# Patient Record
Sex: Male | Born: 1935 | Race: White | Hispanic: No | Marital: Married
Health system: Southern US, Community
[De-identification: ages and names within clinical notes are randomized; demographics above are authoritative.]

---

## 2013-01-29 ENCOUNTER — Observation Stay (HOSPITAL_COMMUNITY)
Admission: EM | Admit: 2013-01-29 | Discharge: 2013-01-30 | Disposition: A | Discharge status: Home / Self Care | Payer: Medicare (Managed Care) | Attending: Orthopedic Surgery | Admitting: Orthopedic Surgery

## 2013-01-29 ENCOUNTER — Encounter (HOSPITAL_COMMUNITY): Payer: Self-pay | Admitting: Emergency Medicine

## 2013-01-29 ENCOUNTER — Emergency Department (HOSPITAL_COMMUNITY): Payer: Medicare (Managed Care) | Admitting: Anesthesiology

## 2013-01-29 ENCOUNTER — Emergency Department (HOSPITAL_COMMUNITY): Payer: Medicare (Managed Care)

## 2013-01-29 ENCOUNTER — Encounter (HOSPITAL_COMMUNITY): Admission: EM | Disposition: A | Discharge status: Home / Self Care | Payer: Self-pay | Source: Home / Self Care

## 2013-01-29 ENCOUNTER — Inpatient Hospital Stay: Admit: 2013-01-29 | Payer: Self-pay | Admitting: Orthopedic Surgery

## 2013-01-29 ENCOUNTER — Encounter (HOSPITAL_COMMUNITY): Payer: Medicare (Managed Care) | Admitting: Anesthesiology

## 2013-01-29 DIAGNOSIS — S61209A Unspecified open wound of unspecified finger without damage to nail, initial encounter: Secondary | ICD-10-CM | POA: Diagnosis not present

## 2013-01-29 DIAGNOSIS — F0390 Unspecified dementia without behavioral disturbance: Secondary | ICD-10-CM | POA: Insufficient documentation

## 2013-01-29 DIAGNOSIS — W298XXA Contact with other powered powered hand tools and household machinery, initial encounter: Secondary | ICD-10-CM | POA: Diagnosis not present

## 2013-01-29 DIAGNOSIS — S62639A Displaced fracture of distal phalanx of unspecified finger, initial encounter for closed fracture: Secondary | ICD-10-CM | POA: Insufficient documentation

## 2013-01-29 DIAGNOSIS — I10 Essential (primary) hypertension: Secondary | ICD-10-CM | POA: Insufficient documentation

## 2013-01-29 DIAGNOSIS — Z79899 Other long term (current) drug therapy: Secondary | ICD-10-CM | POA: Diagnosis not present

## 2013-01-29 DIAGNOSIS — S68119A Complete traumatic metacarpophalangeal amputation of unspecified finger, initial encounter: Secondary | ICD-10-CM | POA: Diagnosis present

## 2013-01-29 DIAGNOSIS — IMO0002 Reserved for concepts with insufficient information to code with codable children: Principal | ICD-10-CM | POA: Insufficient documentation

## 2013-01-29 HISTORY — PX: I & D EXTREMITY: SHX5045

## 2013-01-29 LAB — BASIC METABOLIC PANEL
BUN: 23 mg/dL (ref 6–23)
CHLORIDE: 101 meq/L (ref 96–112)
CO2: 23 mEq/L (ref 19–32)
CREATININE: 1.12 mg/dL (ref 0.50–1.35)
Calcium: 9.1 mg/dL (ref 8.4–10.5)
GFR calc non Af Amer: 57 mL/min — ABNORMAL LOW (ref >90)
GFR, EST AFRICAN AMERICAN: 66 mL/min — AB (ref >90)
Glucose, Bld: 162 mg/dL — ABNORMAL HIGH (ref 70–99)
POTASSIUM: 4.7 meq/L (ref 3.7–5.3)
Sodium: 138 mEq/L (ref 137–147)

## 2013-01-29 LAB — CBC WITH DIFFERENTIAL/PLATELET
BASOS PCT: 0 % (ref 0–1)
Basophils Absolute: 0 10*3/uL (ref 0.0–0.1)
Eosinophils Absolute: 0 10*3/uL (ref 0.0–0.7)
Eosinophils Relative: 0 % (ref 0–5)
HEMATOCRIT: 37.3 % — AB (ref 39.0–52.0)
HEMOGLOBIN: 12.5 g/dL — AB (ref 13.0–17.0)
LYMPHS PCT: 18 % (ref 12–46)
Lymphs Abs: 1.7 10*3/uL (ref 0.7–4.0)
MCH: 31.2 pg (ref 26.0–34.0)
MCHC: 33.5 g/dL (ref 30.0–36.0)
MCV: 93 fL (ref 78.0–100.0)
MONO ABS: 0.4 10*3/uL (ref 0.1–1.0)
MONOS PCT: 5 % (ref 3–12)
NEUTROS PCT: 77 % (ref 43–77)
Neutro Abs: 7.2 10*3/uL (ref 1.7–7.7)
Platelets: 211 10*3/uL (ref 150–400)
RBC: 4.01 MIL/uL — AB (ref 4.22–5.81)
RDW: 12.8 % (ref 11.5–15.5)
WBC: 9.3 10*3/uL (ref 4.0–10.5)

## 2013-01-29 SURGERY — IRRIGATION AND DEBRIDEMENT EXTREMITY
Anesthesia: General | Site: Hand | Laterality: Right

## 2013-01-29 MED ORDER — GLYCOPYRROLATE 0.2 MG/ML IJ SOLN
INTRAMUSCULAR | Status: DC | PRN
  Administered 2013-01-29: 0.2 mg via INTRAVENOUS

## 2013-01-29 MED ORDER — HYDROMORPHONE HCL PF 1 MG/ML IJ SOLN
INTRAMUSCULAR | Status: AC
  Administered 2013-01-29: 0.25 mg via INTRAVENOUS
  Filled 2013-01-29: qty 1

## 2013-01-29 MED ORDER — LIDOCAINE HCL (CARDIAC) 20 MG/ML IV SOLN
INTRAVENOUS | Status: DC | PRN
  Administered 2013-01-29: 100 mg via INTRAVENOUS

## 2013-01-29 MED ORDER — CEFAZOLIN SODIUM-DEXTROSE 2-3 GM-% IV SOLR
INTRAVENOUS | Status: DC | PRN
  Administered 2013-01-29: 2 g via INTRAVENOUS

## 2013-01-29 MED ORDER — ONDANSETRON HCL 4 MG/2ML IJ SOLN
INTRAMUSCULAR | Status: DC | PRN
  Administered 2013-01-29: 4 mg via INTRAVENOUS

## 2013-01-29 MED ORDER — SUCCINYLCHOLINE CHLORIDE 20 MG/ML IJ SOLN
INTRAMUSCULAR | Status: DC | PRN
  Administered 2013-01-29: 100 mg via INTRAVENOUS

## 2013-01-29 MED ORDER — LACTATED RINGERS IV SOLN
INTRAVENOUS | Status: DC | PRN
  Administered 2013-01-29 (×2): via INTRAVENOUS

## 2013-01-29 MED ORDER — ONDANSETRON HCL 4 MG/2ML IJ SOLN: 4.0000 mg | Freq: Once | INTRAMUSCULAR | Status: AC | PRN

## 2013-01-29 MED ORDER — DEXAMETHASONE SODIUM PHOSPHATE 4 MG/ML IJ SOLN
INTRAMUSCULAR | Status: DC | PRN
  Administered 2013-01-29: 4 mg via INTRAVENOUS

## 2013-01-29 MED ORDER — BUPIVACAINE HCL (PF) 0.25 % IJ SOLN
INTRAMUSCULAR | Status: AC
  Filled 2013-01-29: qty 30

## 2013-01-29 MED ORDER — EPHEDRINE SULFATE 50 MG/ML IJ SOLN
INTRAMUSCULAR | Status: DC | PRN
  Administered 2013-01-29: 10 mg via INTRAVENOUS

## 2013-01-29 MED ORDER — SUFENTANIL CITRATE 50 MCG/ML IV SOLN
INTRAVENOUS | Status: DC | PRN
  Administered 2013-01-29: 10 ug via INTRAVENOUS

## 2013-01-29 MED ORDER — SODIUM CHLORIDE 0.9 % IR SOLN
Status: DC | PRN
  Administered 2013-01-29 (×4): 1000 mL

## 2013-01-29 MED ORDER — PROPOFOL 10 MG/ML IV BOLUS
INTRAVENOUS | Status: DC | PRN
  Administered 2013-01-29: 120 mg via INTRAVENOUS

## 2013-01-29 MED ORDER — HYDROMORPHONE HCL PF 1 MG/ML IJ SOLN
0.2500 mg | INTRAMUSCULAR | Status: DC | PRN
  Administered 2013-01-29 – 2013-01-30 (×2): 0.25 mg via INTRAVENOUS

## 2013-01-29 SURGICAL SUPPLY — 49 items
BANDAGE CONFORM 2  STR LF (GAUZE/BANDAGES/DRESSINGS) ×3 IMPLANT
BANDAGE ELASTIC 3 VELCRO ST LF (GAUZE/BANDAGES/DRESSINGS) ×3 IMPLANT
BANDAGE ELASTIC 4 VELCRO ST LF (GAUZE/BANDAGES/DRESSINGS) IMPLANT
BANDAGE GAUZE ELAST BULKY 4 IN (GAUZE/BANDAGES/DRESSINGS) IMPLANT
CLOTH BEACON ORANGE TIMEOUT ST (SAFETY) IMPLANT
CORDS BIPOLAR (ELECTRODE) ×3 IMPLANT
CUFF TOURNIQUET SINGLE 18IN (TOURNIQUET CUFF) ×3 IMPLANT
CUFF TOURNIQUET SINGLE 24IN (TOURNIQUET CUFF) IMPLANT
DRSG ADAPTIC 3X8 NADH LF (GAUZE/BANDAGES/DRESSINGS) ×3 IMPLANT
ELECT REM PT RETURN 9FT ADLT (ELECTROSURGICAL)
ELECTRODE REM PT RTRN 9FT ADLT (ELECTROSURGICAL) IMPLANT
GAUZE XEROFORM 1X8 LF (GAUZE/BANDAGES/DRESSINGS) IMPLANT
GAUZE XEROFORM 5X9 LF (GAUZE/BANDAGES/DRESSINGS) ×3 IMPLANT
GLOVE BIOGEL M STRL SZ7.5 (GLOVE) IMPLANT
GLOVE SS BIOGEL STRL SZ 8 (GLOVE) ×2 IMPLANT
GLOVE SUPERSENSE BIOGEL SZ 8 (GLOVE) ×4
GOWN SRG XL XLNG 56XLVL 4 (GOWN DISPOSABLE) ×1 IMPLANT
GOWN STRL NON-REIN LRG LVL3 (GOWN DISPOSABLE) ×6 IMPLANT
GOWN STRL NON-REIN XL XLG LVL4 (GOWN DISPOSABLE) ×2
HANDPIECE INTERPULSE COAX TIP (DISPOSABLE)
KIT BASIN OR (CUSTOM PROCEDURE TRAY) ×3 IMPLANT
KIT ROOM TURNOVER OR (KITS) ×3 IMPLANT
MANIFOLD NEPTUNE II (INSTRUMENTS) ×3 IMPLANT
NEEDLE HYPO 25GX1X1/2 BEV (NEEDLE) ×3 IMPLANT
NS IRRIG 1000ML POUR BTL (IV SOLUTION) ×3 IMPLANT
PACK ORTHO EXTREMITY (CUSTOM PROCEDURE TRAY) ×3 IMPLANT
PAD ARMBOARD 7.5X6 YLW CONV (MISCELLANEOUS) ×6 IMPLANT
PAD CAST 4YDX4 CTTN HI CHSV (CAST SUPPLIES) IMPLANT
PADDING CAST ABS 3INX4YD NS (CAST SUPPLIES) ×2
PADDING CAST ABS COTTON 3X4 (CAST SUPPLIES) ×1 IMPLANT
PADDING CAST COTTON 4X4 STRL (CAST SUPPLIES)
SET HNDPC FAN SPRY TIP SCT (DISPOSABLE) IMPLANT
SPLINT PLASTER EXTRA FAST 3X15 (CAST SUPPLIES) ×2
SPLINT PLASTER GYPS XFAST 3X15 (CAST SUPPLIES) ×1 IMPLANT
SPONGE GAUZE 4X4 12PLY (GAUZE/BANDAGES/DRESSINGS) ×3 IMPLANT
SPONGE LAP 18X18 X RAY DECT (DISPOSABLE) IMPLANT
SPONGE LAP 4X18 X RAY DECT (DISPOSABLE) ×3 IMPLANT
SUT CHROMIC 3 0 PS 2 (SUTURE) ×9 IMPLANT
SUT CHROMIC 4 0 PS 2 18 (SUTURE) ×9 IMPLANT
SUT CHROMIC 5 0 P 3 (SUTURE) ×3 IMPLANT
SUT PROLENE 4 0 PS 2 18 (SUTURE) ×9 IMPLANT
SYR CONTROL 10ML LL (SYRINGE) ×3 IMPLANT
TOWEL OR 17X24 6PK STRL BLUE (TOWEL DISPOSABLE) ×3 IMPLANT
TOWEL OR 17X26 10 PK STRL BLUE (TOWEL DISPOSABLE) ×3 IMPLANT
TUBE ANAEROBIC SPECIMEN COL (MISCELLANEOUS) IMPLANT
TUBE CONNECTING 12'X1/4 (SUCTIONS) ×1
TUBE CONNECTING 12X1/4 (SUCTIONS) ×2 IMPLANT
WATER STERILE IRR 1000ML POUR (IV SOLUTION) IMPLANT
YANKAUER SUCT BULB TIP NO VENT (SUCTIONS) ×3 IMPLANT

## 2013-01-29 NOTE — H&P (Signed)
Cameron Schwinn is an 80 y.o. male.   Chief Complaint: Table saw injury right hand HPI: Patient presents for surgical management status post table saw injury. He has severe lacerations with near complete amputation to the small finger as was major intensive damage to the ring finger and a middle fingertip injury that involves skin subcutaneous tissue and bone. His index finger also has severe deformity and soft tissue loss.  I discussed with he and his family the issues. He was referred from [place] [facility] for evaluation and surgical management. I would recommend immediate I&D and repair is necessary including revision amputations. He is 80 years of age. He is still using a table saw but his had another recent injury on the left hand according to the ER and his notes.  He denies neck back chest or abdominal pain. He is alert and oriented  History reviewed. No pertinent past medical history.  History reviewed. No pertinent past surgical history.  History reviewed. No pertinent family history. Social History:  has no tobacco, alcohol, and drug history on file.  Allergies: Not on File   (Not in a hospital admission)  No results found for this or any previous visit (from the past 48 hour(s)). No results found.  Review of Systems  Constitutional: Negative.   HENT: Negative.   Respiratory: Negative.   Cardiovascular: Negative.   Gastrointestinal: Negative.   Musculoskeletal:       See chart. Patient has a severe table saw injury to the right hand  Neurological: Negative.   Endo/Heme/Allergies: Negative.   Psychiatric/Behavioral: Negative.     Blood pressure 155/63, pulse 59, temperature 97.3 F (36.3 C), temperature source Oral, resp. rate 15, weight 81.647 kg (180 lb), SpO2 99.00%. Physical Exam soft tissue injury to the index and middle finger with near-complete dictation to the middle fingertip. He has a lot of bleeding and swelling about the hand in general. I've loosened his bandage to  review the wounds. Small finger has significant almost complete amputation. The ring finger has significant soft tissue injury as well. The patient has no evidence of wrist or forearm deformity or injury. I've reviewed this with him at length.  Left arm has IV access and is neurovascularly intact. Lower extremity examination is benign.  Marland Kitchen.The patient is alert and oriented in no acute distress the patient complains of pain in the affected upper extremity.  The patient is noted to have a normal HEENT exam.  Lung fields show equal chest expansion and no shortness of breath  abdomen exam is nontender without distention.  Lower extremity examination does not show any fracture dislocation or blood clot symptoms.  Pelvis is stable neck and back are stable and nontender  Assessment/Plan Table saw injury right hand. Will plan emergent I&D and repair is necessary with revision amputations as necessary. He understands this and desires to proceed.  Marland Kitchen.We are planning surgery for your upper extremity. The risk and benefits of surgery include risk of bleeding infection anesthesia damage to normal structures and failure of the surgery to accomplish its intended goals of relieving symptoms and restoring function with this in mind we'll going to proceed. I have specifically discussed with the patient the pre-and postoperative regime and the does and don'ts and risk and benefits in great detail. Risk and benefits of surgery also include risk of dystrophy chronic nerve pain failure of the healing process to go onto completion and other inherent risks of surgery The relavent the pathophysiology of the disease/injury process, as well  as the alternatives for treatment and postoperative course of action has been discussed in great detail with the patient who desires to proceed.  We will do everything in our power to help you (the patient) restore function to the upper extremity. Is a pleasure to see this patient  today.  Karen ChafeGRAMIG III,Brandell Maready M 01/29/2013, 7:57 PM

## 2013-01-29 NOTE — ED Notes (Signed)
MD at bedside. Gramger

## 2013-01-29 NOTE — Transfer of Care (Signed)
Immediate Anesthesia Transfer of Care Note  Patient: Elijah Reilly  Procedure(s) Performed: Procedure(s): IRRIGATION AND DEBRIDEMENT HAND REV AMPUTATION REPAIR AS NEEDED (Right)  Patient Location: PACU  Anesthesia Type:General  Level of Consciousness: sedated, patient cooperative and responds to stimulation  Airway & Oxygen Therapy: Patient Spontanous Breathing and Patient connected to nasal cannula oxygen  Post-op Assessment: Report given to PACU RN, Post -op Vital signs reviewed and stable and Patient moving all extremities X 4  Post vital signs: Reviewed and stable  Complications: No apparent anesthesia complications

## 2013-01-29 NOTE — ED Notes (Addendum)
Pt from [facility], sent here for surgery. Pt denies any pain at this time. Pt states they gave him pain medicine [facility] however the pt is unsure. Pt presents with amputation of his right pinky finger and his right ring finger. Bleeding is controlled at this time.

## 2013-01-29 NOTE — Anesthesia Preprocedure Evaluation (Addendum)
Anesthesia Evaluation  Patient identified by MRN, date of birth, ID band Patient awake    Reviewed: Allergy & Precautions, H&P , NPO status , Patient's Chart, lab work & pertinent test results  Airway       Dental   Pulmonary pneumonia -,          Cardiovascular hypertension,     Neuro/Psych    GI/Hepatic   Endo/Other    Renal/GU      Musculoskeletal   Abdominal   Peds  Hematology   Anesthesia Other Findings Dementia  Reproductive/Obstetrics                          Anesthesia Physical Anesthesia Plan  ASA: II and emergent  Anesthesia Plan: General   Post-op Pain Management:    Induction: Intravenous  Airway Management Planned: LMA and Oral ETT  Additional Equipment:   Intra-op Plan:   Post-operative Plan: Extubation in OR  Informed Consent: I have reviewed the patients History and Physical, chart, labs and discussed the procedure including the risks, benefits and alternatives for the proposed anesthesia with the patient or authorized representative who has indicated his/her understanding and acceptance.     Plan Discussed with:   Anesthesia Plan Comments:         Anesthesia Quick Evaluation

## 2013-01-29 NOTE — Op Note (Signed)
See Dictation # [id number] Amanda PeaGramig MD

## 2013-01-29 NOTE — ED Notes (Signed)
Called xray at this time for portable

## 2013-01-29 NOTE — Anesthesia Postprocedure Evaluation (Signed)
  Anesthesia Post-op Note  Patient: Elijah Reilly  Procedure(s) Performed: Procedure(s): IRRIGATION AND DEBRIDEMENT HAND REV AMPUTATION REPAIR AS NEEDED (Right)  Patient Location: PACU  Anesthesia Type:General  Level of Consciousness: awake, sedated and patient cooperative  Airway and Oxygen Therapy: Patient Spontanous Breathing  Post-op Pain: mild  Post-op Assessment: Post-op Vital signs reviewed, Patient's Cardiovascular Status Stable, Respiratory Function Stable, Patent Airway, No signs of Nausea or vomiting and Pain level controlled  Post-op Vital Signs: stable  Complications: No apparent anesthesia complications

## 2013-01-29 NOTE — ED Notes (Signed)
Pt presents with amputation to right hand with a table saw at approx. 14:30 today. Last tetanus unknown. [place] was [place] [facility] Pt states that wife "as the papers"

## 2013-01-30 ENCOUNTER — Encounter (HOSPITAL_COMMUNITY): Payer: Self-pay | Admitting: Family

## 2013-01-30 DIAGNOSIS — IMO0002 Reserved for concepts with insufficient information to code with codable children: Secondary | ICD-10-CM | POA: Diagnosis not present

## 2013-01-30 MED ORDER — FAMOTIDINE 20 MG PO TABS
20.0000 mg | ORAL_TABLET | Freq: Two times a day (BID) | ORAL | Status: DC | PRN
  Filled 2013-01-30: qty 1

## 2013-01-30 MED ORDER — CEFAZOLIN SODIUM 1-5 GM-% IV SOLN
1.0000 g | Freq: Three times a day (TID) | INTRAVENOUS | Status: DC
  Administered 2013-01-30 (×2): 1 g via INTRAVENOUS
  Filled 2013-01-30 (×4): qty 50

## 2013-01-30 MED ORDER — ONDANSETRON HCL 4 MG PO TABS: 4.0000 mg | ORAL_TABLET | Freq: Four times a day (QID) | ORAL | Status: DC | PRN

## 2013-01-30 MED ORDER — PROMETHAZINE HCL 25 MG RE SUPP: 12.5000 mg | Freq: Four times a day (QID) | RECTAL | Status: DC | PRN

## 2013-01-30 MED ORDER — HYDROCODONE-ACETAMINOPHEN 5-325 MG PO TABS
1.0000 | ORAL_TABLET | ORAL | Status: DC | PRN
  Administered 2013-01-30: 1 via ORAL
  Filled 2013-01-30: qty 1

## 2013-01-30 MED ORDER — CEFAZOLIN SODIUM 1-5 GM-% IV SOLN
1.0000 g | INTRAVENOUS | Status: AC
  Administered 2013-01-30: 1 g via INTRAVENOUS
  Filled 2013-01-30: qty 50

## 2013-01-30 MED ORDER — METHOCARBAMOL 500 MG PO TABS: 500.0000 mg | ORAL_TABLET | Freq: Four times a day (QID) | ORAL | Status: DC | PRN

## 2013-01-30 MED ORDER — ONDANSETRON HCL 4 MG/2ML IJ SOLN: 4.0000 mg | Freq: Four times a day (QID) | INTRAMUSCULAR | Status: DC | PRN

## 2013-01-30 MED ORDER — MORPHINE SULFATE 2 MG/ML IJ SOLN: 1.0000 mg | INTRAMUSCULAR | Status: DC | PRN

## 2013-01-30 MED ORDER — METHOCARBAMOL 100 MG/ML IJ SOLN: 500.0000 mg | Freq: Four times a day (QID) | INTRAVENOUS | Status: DC | PRN

## 2013-01-30 MED ORDER — DOCUSATE SODIUM 100 MG PO CAPS
100.0000 mg | ORAL_CAPSULE | Freq: Two times a day (BID) | ORAL | Status: DC
  Administered 2013-01-30 (×2): 100 mg via ORAL
  Filled 2013-01-30 (×3): qty 1

## 2013-01-30 MED ORDER — HYDROCODONE-ACETAMINOPHEN 5-325 MG PO TABS: 1.0000 | ORAL_TABLET | ORAL | Status: AC | PRN

## 2013-01-30 MED ORDER — CEPHALEXIN 500 MG PO CAPS: 500.0000 mg | ORAL_CAPSULE | Freq: Four times a day (QID) | ORAL | Status: AC

## 2013-01-30 MED ORDER — LACTATED RINGERS IV SOLN
INTRAVENOUS | Status: DC
  Administered 2013-01-30: 02:00:00 via INTRAVENOUS
  Filled 2013-01-30 (×3): qty 1000

## 2013-01-30 MED ORDER — VITAMIN C 500 MG PO TABS
1000.0000 mg | ORAL_TABLET | Freq: Every day | ORAL | Status: DC
  Administered 2013-01-30: 1000 mg via ORAL
  Filled 2013-01-30: qty 2

## 2013-01-30 MED ORDER — ASCORBIC ACID 1000 MG PO TABS: 1000.0000 mg | ORAL_TABLET | Freq: Every day | ORAL | Status: AC

## 2013-01-30 NOTE — Discharge Instructions (Signed)
Fingertip Injuries and Amputations Fingertip injuries are common and often get injured because they are last to escape when pulling your hand out of harm's way. You have amputated (cut off) part of your finger. How this turns out depends largely on how much was amputated. If just the tip is amputated, often the end of the finger will grow back and the finger may return to much the same as it was before the injury.  If more of the finger is missing, your caregiver has done the best with the tissue remaining to allow you to keep as much finger as is possible. Your caregiver after checking your injury has tried to leave you with a painless fingertip that has durable, feeling skin. If possible, your caregiver has tried to maintain the finger's length and appearance and preserve its fingernail.  Please read the instructions outlined below and refer to this sheet in the next few weeks. These instructions provide you with general information on caring for yourself. Your caregiver may also give you specific instructions. While your treatment has been done according to the most current medical practices available, unavoidable complications occasionally occur. If you have any problems or questions after discharge, please call your caregiver. HOME CARE INSTRUCTIONS   You may resume normal diet and activities as directed or allowed.  Keep your hand elevated above the level of your heart. This helps decrease pain and swelling.  Keep ice packs (or a bag of ice wrapped in a towel) on the injured area for 15-20 minutes, 03-04 times per day, for the first two days.  Change dressings if necessary or as directed.  Clean the wound daily or as directed.  Only take over-the-counter or prescription medicines for pain, discomfort, or fever as directed by your caregiver.  Keep appointments as directed. SEEK IMMEDIATE MEDICAL CARE IF:  You develop redness, swelling, numbness or increasing pain in the wound.  There is  pus coming from the wound.  You develop an unexplained oral temperature above 102 F (38.9 C) or as your caregiver suggests.  There is a foul (bad) smell coming from the wound or dressing.  There is a breaking open of the wound (edges not staying together) after sutures or staples have been removed. MAKE SURE YOU:   Understand these instructions.  Will watch your condition.  Will get help right away if you are not doing well or get worse. Document Released: 11/21/2004 Document Revised: 03/25/2011 Document Reviewed: 10/21/2007 [facility] Patient Information 2014 [employer], [place]. Marland Kitchen..Keep bandage clean and dry.  Call for any problems.  No smoking.  Criteria for driving a car: you should be off your pain medicine for 7-8 hours, able to drive one handed(confident), thinking clearly and feeling able in your judgement to drive. Continue elevation as it will decrease swelling.  If instructed by MD move your fingers within the confines of the bandage/splint.  Use ice if instructed by your MD. Call immediately for any sudden loss of feeling in your hand/arm or change in functional abilities of the extremity.

## 2013-01-30 NOTE — Progress Notes (Signed)
Utilization Review Completed.  

## 2013-01-30 NOTE — Op Note (Signed)
NAMEABHINAV, Elijah Reilly NO.:  [id number]  MEDICAL RECORD NO.:  [id number]  LOCATION:  [id number]                        FACILITY:  [facility]  PHYSICIAN:  Dionne Ano. Kelbie Moro, M.D.DATE OF BIRTH:  1922-12-10  DATE OF PROCEDURE:  01/29/2013 DATE OF DISCHARGE:                              OPERATIVE REPORT   PREOPERATIVE DIAGNOSIS:  Table saw injury, right hand with near amputation to the small finger, middle finger, ring finger, and laceration to the index finger.  POSTOPERATIVE DIAGNOSIS:  Table saw injury, right hand with near amputation to the small finger, middle finger, ring finger, and laceration to the index finger.  PROCEDURE: 1. Right index finger irrigation and debridement of skin and     subcutaneous tissue, with complex skin repair. 2. I and D, right middle finger, skin, subcutaneous tissue, bone.     This was an excisional debridement in nature. 3. Nail plate removal, right middle finger. 4. Nail bed repair, right middle finger. 5. Open treatment, distal phalanx fracture, right middle finger. 6. Rotation flap coverage, right middle finger. 7. Irrigation and debridement of skin, subcutaneous tissue, and bone,     right ring finger, this was an excisional debridement. 8. Nail plate removal, right ring finger. 9. Nail bed repair, right ring finger. 10.Open treatment distal phalanx fracture, right ring finger. 11.Resection and bearing procedure ulnar digital nerve, right ring     finger, with crushing and cauterization technique. 12.Volar advancement flap placement, right ring finger. 13.Complex wound repair, 6 cm segment, right ring finger. 14.Ray amputation, right small finger with bilateral neurectomies.  SURGEON:  Dionne Ano. Amanda Pea, M.D.  ASSISTANT:  None.  COMPLICATIONS:  None.  ANESTHESIA:  General.  TOURNIQUET TIME:  Less than an hour.  INDICATIONS:  This is an 80 year old male with some degree of dementia. He was working with a table saw today  and sustained severe injuries as described.  The patient presents for definitive care.  He was transferred from [place] for Pharmac[facility]. He and his family understand risks and benefits and desires to proceed.  OPERATION IN DETAIL:  The patient was seen by myself and anesthesia. Taken to the operative suite and underwent smooth induction of general anesthetic by Dr. Katrinka Blazing.  Laid supine and appropriately padded, prepped and draped in usual sterile fashion.  Body parts were well padded.  Time- out was called.  Once this was accomplished, we set out with irrigation. The patient had initial irrigant.  I should note that, I supervised the prep and drape.  I performed by myself 3 separate Betadine scrubs on the arm.  Once sterile field was secured and time-out have been called, we then looked at the open wounds.  The patient had a very comminuted injury to the small finger and due to this it was quite apparent, ray amputation revision would be in his best interest as opposed to any type of reconstruction.  He had segmental loss, dysvascular features, and disarray of the soft tissues.  At this juncture, I identified the ulnar digital nerve to the small finger, ulnar digital artery to the small finger, as well as radial digital nerve and radial digital  artery to the small finger and tagged these.  I then performed a fillet flap dissection and removed the small finger keeping the dorsal flap of tissue for later repair, and ray amputation coverage.  Once this was done, I then very carefully resected the metacarpal.  The metacarpal was resected and beveled.  It was smoothed with rongeur and orthopedic instruments.  Bone wax was ultimately placed about the tip.  It had a nice smooth surface.  Following this, I irrigated copiously the area.  Following this, I then performed nail plate removal to the ring finger, and following this I performed nail plate removal to the  middle finger. Once this was done, we very carefully looked at the nail beds, the volar soft tissue apparatus which was nearly avulsed, but did have some degree of vascularity.  The middle and ring fingers had open fractures with exposed bone.  At this juncture, I brought in 3 L of saline and irrigated all areas including the index finger.  This involved skin and subcutaneous tissue. This was an excisional debridement.  I also performed I and D of the ring finger and middle finger, which involved an open fracture about both areas.  This was an I and D of skin, subcutaneous tissue, bone, nail bed, and nail plate type tissue. This was an excisional debridement.  Following this, I performed irrigation and debridement of the open fracture of course about the small finger.  Greater than 3 L of fluid were placed in the wound.  Following this, drapes were changed and we then very carefully and cautiously went from index to small finger with the repairs.  The index finger underwent complex tissue repair of 2 cm and coverage of the skin tissue after the debridement.  Following this, I performed open treatment of the distal phalanx fracture of the middle finger followed by complex nail bed repair with 6- 0 and 5-0 chromic suture, followed by volar advancement/rotation type flap coverage for the tip.  Fortunately at this time, the tourniquet was deflated and the patient did have good refill and I was pleased with the coverage.  Thus the middle finger underwent nail plate removal, nail bed repair, open treatment of the distal phalanx fracture, as well as complex tissue repair in a volar advancement flap/rotation type flap for coverage.  Following this, the ring finger was dressed similarly with open treatment of a distal phalanx fracture followed by complex nail bed repair with 5-0 and 6-0 chromic suture under 4.0 loupe magnification followed by volar plate advancement flap.  This was done  without difficulty.  The tissue was advanced and did have healthy vascularity.  The ring finger also had tremendous injury about the most ulnar aspect. The patient had a segmental ulnar digital nerve injury and thus I chose to resect this as I did not feel it was worthy of an attempted repair. After this resection and bearing was accomplished, I then performed a complex repair of the tissue/skin.  I should note crushing and cauterization technique was used and this was done with tourniquet deflated and vascularity was adequate.  The nerve was buried nicely and this was a burying procedure of the ulnar digital nerve ring finger.  Following this, I then performed complex digital nerve resections with crushing and cauterization technique of the radial and ulnar digital nerves about the small finger.  I cauterized the arterial branches of the ulnar digital artery and made sure that I sculpted the tissue nicely.  Skin edges were  trimmed appropriately with combination knife and scissor tip and with tourniquet deflated of course the ray amputation was covered with skin nicely.  I used a combination of Prolene and chromic suture for this.  Excellent refill of soft compartments were noted.  I dressed the patient with Adaptic, Xeroform, and placed Adaptic under the eponychial fold.  The patient tolerated this well.  Gauze, Kling, TD gauze, as well as Kerlix were then placed. The patient tolerated this well.  There were no complicating features. The patient had good refill and no immediate issues.  He will be admitted for IV antibiotics, pain control, general postop observation, and other measures.  He tolerated the procedure well.  There were no issues.  We are going to monitor his condition closely in the postop period of course.  I want to keep him clean and dry for at least 10-14 days and check his wounds in the office.  I will have to go ahead and put TD gauze on the fingers, make  finger splints for him as well as a short volar splint.  I am going to have our therapist aid us in this regard.  I have counseled he and his family in regards to all issues and intraoperative findings.  Unfortunately, this was quite a devastating injury for him.  We will do our best to give him the most functional extremity possible.     Dionne AnoWilliam M. Amanda PeaGramig, M.D.     [facility]/MEDQ  D:  01/29/2013  T:  01/30/2013  Job:  [id number]

## 2013-01-30 NOTE — Discharge Summary (Signed)
Physician Discharge Summary  Patient ID: Elijah Reilly MRN: [id number] DOB/AGE: Mar 13, 1922 80 y.o.  Admit date: 01/29/2013 Discharge date: 01/30/2013  Admission Diagnoses: Table saw injury to the right hand with significant soft tissue disarray and near amputation to the small finger, distal tip amputations to the wound and middle finger  Discharge Diagnoses: Same improved Active Problems:   Amputation, finger, traumatic   Discharged Condition: Stable Hospital Course: The patient is a pleasant 80 year old gentleman who unfortunately sustained a significant table saw injury to his right hand resulting in significant soft tissue disarray and open fractures and near amputation to the small finger as well as distal tip amputations an open fractures of the ring and middle finger distal tips. The patient was seen in the emergency room setting and was noted to have significant injuries to the hand requiring urgent operative care. Preoperative laboratory data is and radiographs were reviewed the patient was cleared for surgical endeavors. The patient underwent division of the fingers with a resultant amputation of the small finger requiring a wound revision tolerated this well please see operative report for full details. The patient was admitted for IV antibiotics, pain control, and general observation.  On postoperative day #1 the patient was awake, he is not having significant pain about the hand. His family was in the wound. I discussed with him all issues to include the injury and surgical process that had to be performed. The patient denied any nausea, vomiting, fever, chills, chest pain or shortness of breath. Was noted to be receiving IV antibiotics in the form of Ancef without difficulties. Questions were discussed with his family members in regards to his discharge plans. We will plan on discharging him today after therapy sees him to ensure he able in terms of his gait. Have spent a great deal of time  at bedside discussing with the family discharge plans. Receive an additional dose of Ancef at length today and thereafter we'll be able to discharge to home. I discussed with he and his family the need to elevate the upper extremity and keeping his dressings clean and intact and do not remove them. We will plan on seeing him at office setting in approximately 2 weeks with therapy initially followed by [place].  Consults: none   Treatments: PROCEDURE:  1. Right index finger irrigation and debridement of skin and  subcutaneous tissue, with complex skin repair.  2. I and D, right middle finger, skin, subcutaneous tissue, bone.  This was an excisional debridement in nature.  3. Nail plate removal, right middle finger.  4. Nail bed repair, right middle finger.  5. Open treatment, distal phalanx fracture, right middle finger.  6. Rotation flap coverage, right middle finger.  7. Irrigation and debridement of skin, subcutaneous tissue, and bone,  right ring finger, this was an excisional debridement.  8. Nail plate removal, right ring finger.  9. Nail bed repair, right ring finger.  10.Open treatment distal phalanx fracture, right ring finger.  11.Resection and bearing procedure ulnar digital nerve, right ring  finger, with crushing and cauterization technique.  12.Volar advancement flap placement, right ring finger.  13.Complex wound repair, 6 cm segment, right ring finger.  14.Ray amputation, right small finger with bilateral neurectomies.    Discharge Exam: Blood pressure 153/61, pulse 60, temperature 98.2 F (36.8 C), temperature source Oral, resp. rate 16, height 5\' 10"  (1.778 m), weight 83.915 kg (185 lb), SpO2 98.00%. Marland Kitchen.The patient is alert and oriented in no acute distress the patient complains of pain  in the affected upper extremity.  The patient is noted to have a normal HEENT exam.  Lung fields show equal chest expansion and no shortness of breath  abdomen exam is nontender without  distention.  Lower extremity examination does not show any fracture dislocation or blood clot symptoms.  Pelvis is stable neck and back are stable and nontender  evaluation of the right upper that his dressings and splint are intact no signs of infection at this juncture or ascending cellulitis. Have reinforced the distal portion of his dressings thumb has excellent refill and sensation  Disposition: Final discharge disposition not confirmed  Discharge Orders   Future Orders Complete By Expires   Call MD / Call 911  As directed    Comments:     If you experience chest pain or shortness of breath, CALL 911 and be transported to the hospital emergency room.  If you develope a fever above 101 F, pus (white drainage) or increased drainage or redness at the wound, or calf pain, call your surgeon's office.   Constipation Prevention  As directed    Comments:     Drink plenty of fluids.  Prune juice may be helpful.  You may use a stool softener, such as Colace (over the counter) 100 mg twice a day.  Use MiraLax (over the counter) for constipation as needed.   Diet - low sodium heart healthy  As directed    Discharge instructions  As directed    Comments:     Marland KitchenMarland KitchenKeep bandage clean and dry.  Call for any problems.  No smoking.  Criteria for driving a car: you should be off your pain medicine for 7-8 hours, able to drive one handed(confident), thinking clearly and feeling able in your judgement to drive. Continue elevation as it will decrease swelling.  If instructed by MD move your fingers within the confines of the bandage/splint.  Use ice if instructed by your MD. Call immediately for any sudden loss of feeling in your hand/arm or change in functional abilities of the extremity. Marland Kitchen.We recommend that you to take vitamin C 1000 mg a day to promote healing we also recommend that if you require her pain medicine that he take a stool softener to prevent constipation as most pain medicines will have constipation  side effects. We recommend either Peri-Colace or Senokot and recommend that you also consider adding MiraLAX to prevent the constipation affects from pain medicine if you are required to use them. These medicines are over the counter and maybe purchased at a local pharmacy.   Increase activity slowly as tolerated  As directed        Medication List         ascorbic acid 1000 MG tablet  Commonly known as:  VITAMIN C  Take 1 tablet (1,000 mg total) by mouth daily.     cephALEXin 500 MG capsule  Commonly known as:  KEFLEX  Take 1 capsule (500 mg total) by mouth 4 (four) times daily.     donepezil 10 MG tablet  Commonly known as:  ARICEPT  Take 10 mg by mouth at bedtime.     HYDROcodone-acetaminophen 5-325 MG per tablet  Commonly known as:  NORCO/VICODIN  Take 1-2 tablets by mouth every 4 (four) hours as needed (Pain).     tamsulosin 0.4 MG Caps capsule  Commonly known as:  FLOMAX  Take 0.4 mg by mouth daily.           Follow-up Information   Follow  up with Karen ChafeGRAMIG III,WILLIAM M, MD. (Our office will call you with appointment dates and time)    Specialty:  Orthopedic Surgery   Contact information:   [address] [place] [id number] [phone]       Signed: Sheran LawlessBUCHANAN,Kalvin Buss L 01/30/2013, 10:10 AM

## 2013-01-30 NOTE — Progress Notes (Signed)
Orthopedic Tech Progress Note Patient Details:  Elijah Reilly Nov 21, 1922 [id number]  Ortho Devices Type of Ortho Device: Arm sling Ortho Device/Splint Interventions: Ordered   Sheria Rosello T 01/30/2013, 1:34 PM

## 2013-01-30 NOTE — Addendum Note (Signed)
Addendum created 01/30/13 2045 by Melina SchoolsVernon J Zandra Lajeunesse, CRNA   Modules edited: Anesthesia LDA

## 2013-02-02 ENCOUNTER — Encounter (HOSPITAL_COMMUNITY): Payer: Self-pay | Admitting: Orthopedic Surgery

## 2014-11-08 IMAGING — CR DG CHEST 1V PORT
1 series · 1 of 1 positions shown · non-contrast
Comparison: 08/29/2012

CLINICAL DATA: Preop, hand trauma

EXAM:
PORTABLE CHEST - 1 VIEW

[AP]
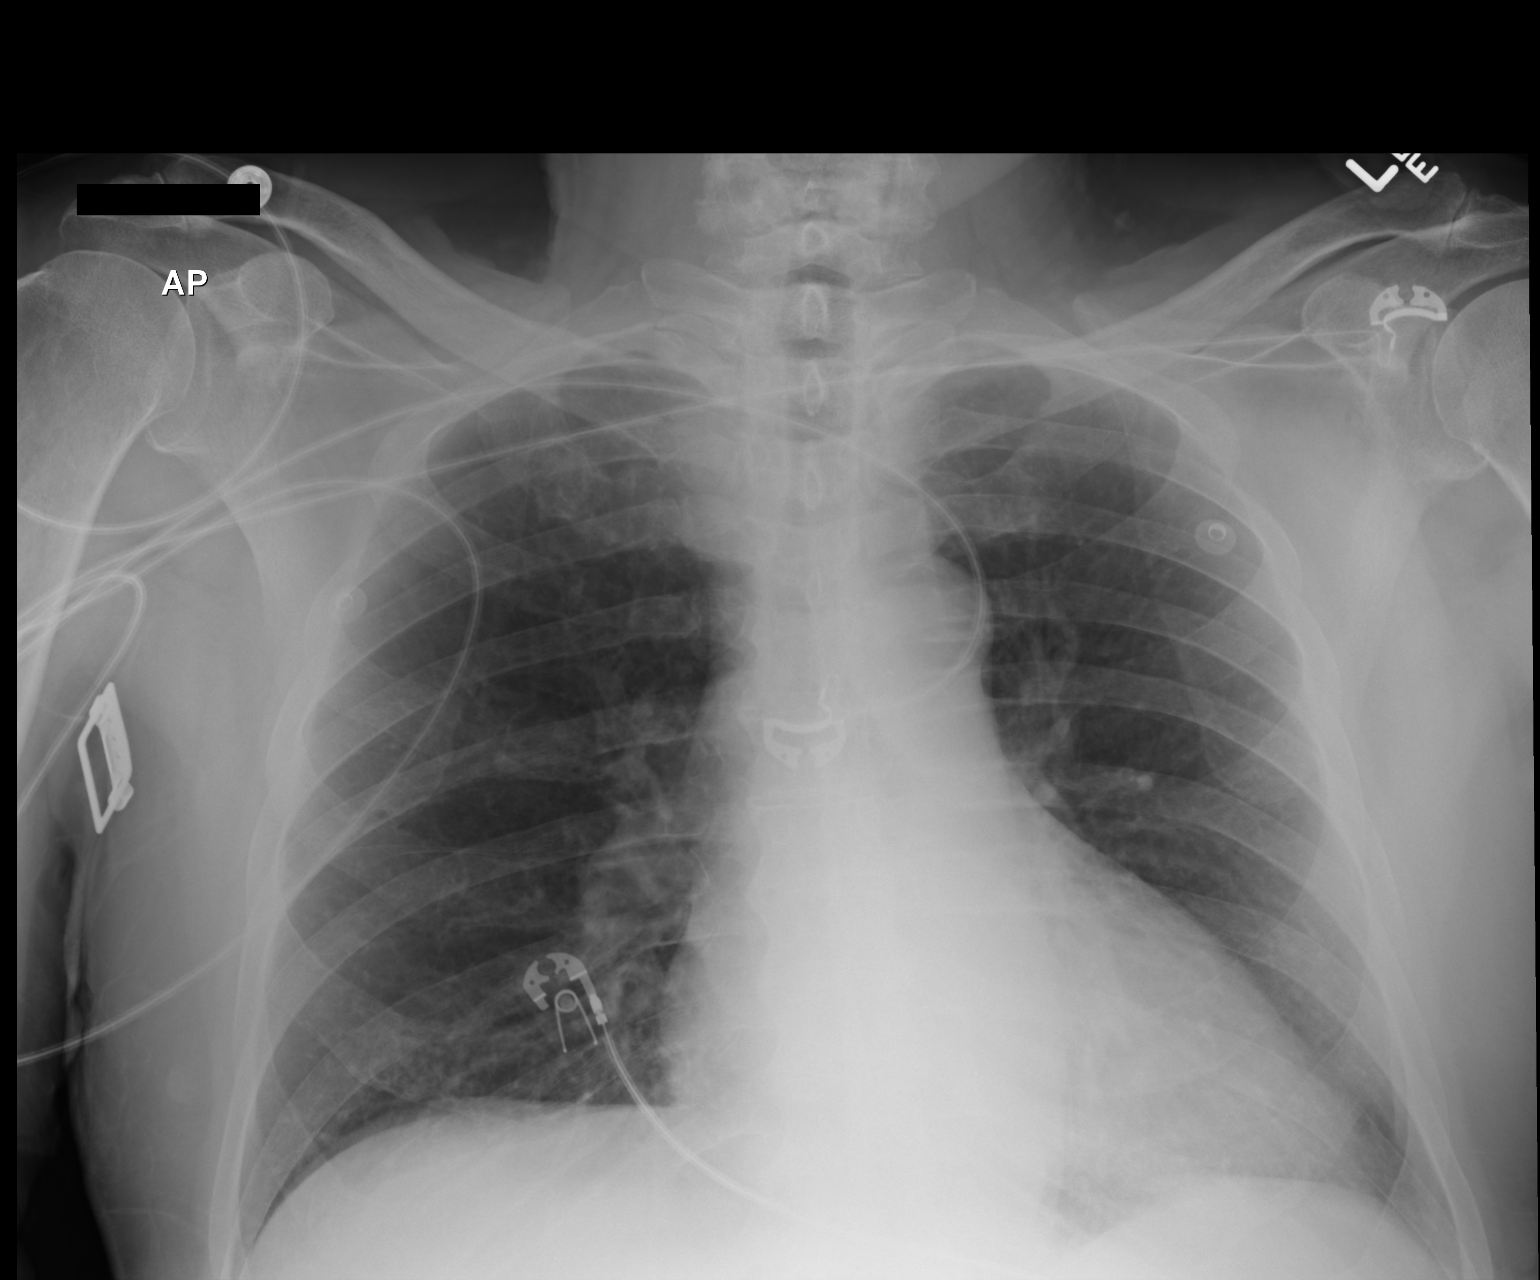

[1 of 1 positions shown; findings below may reference images not displayed]

FINDINGS: Lungs are clear.  No pleural effusion or pneumothorax.

The heart is top-normal in size.
IMPRESSION: No evidence of acute cardiopulmonary disease.

## 2021-11-14 DEATH — deceased
